# Patient Record
Sex: Female | Born: 1995 | Race: White | Hispanic: No | Marital: Single | Smoking: Never smoker
Health system: Southern US, Community
[De-identification: ages and names within clinical notes are randomized; demographics above are authoritative.]

## PROBLEM LIST (undated history)

## (undated) DIAGNOSIS — F39 Unspecified mood [affective] disorder: Secondary | ICD-10-CM

## (undated) DIAGNOSIS — F32A Depression, unspecified: Secondary | ICD-10-CM

## (undated) DIAGNOSIS — F419 Anxiety disorder, unspecified: Secondary | ICD-10-CM

## (undated) DIAGNOSIS — F329 Major depressive disorder, single episode, unspecified: Secondary | ICD-10-CM

## (undated) DIAGNOSIS — N946 Dysmenorrhea, unspecified: Secondary | ICD-10-CM

## (undated) DIAGNOSIS — F1910 Other psychoactive substance abuse, uncomplicated: Secondary | ICD-10-CM

## (undated) HISTORY — DX: Major depressive disorder, single episode, unspecified: F32.9

## (undated) HISTORY — DX: Anxiety disorder, unspecified: F41.9

## (undated) HISTORY — PX: GANGLION CYST EXCISION: SHX1691

## (undated) HISTORY — DX: Dysmenorrhea, unspecified: N94.6

## (undated) HISTORY — DX: Other psychoactive substance abuse, uncomplicated: F19.10

## (undated) HISTORY — DX: Depression, unspecified: F32.A

---

## 2016-01-26 ENCOUNTER — Emergency Department (HOSPITAL_COMMUNITY)
Admission: EM | Admit: 2016-01-26 | Discharge: 2016-01-26 | Disposition: A | Discharge status: Home / Self Care | Payer: 59 | Attending: Emergency Medicine | Admitting: Emergency Medicine

## 2016-01-26 ENCOUNTER — Encounter (HOSPITAL_COMMUNITY): Payer: Self-pay

## 2016-01-26 ENCOUNTER — Emergency Department (HOSPITAL_COMMUNITY): Payer: 59

## 2016-01-26 DIAGNOSIS — Z8659 Personal history of other mental and behavioral disorders: Secondary | ICD-10-CM | POA: Insufficient documentation

## 2016-01-26 DIAGNOSIS — K5900 Constipation, unspecified: Secondary | ICD-10-CM | POA: Insufficient documentation

## 2016-01-26 DIAGNOSIS — R1031 Right lower quadrant pain: Secondary | ICD-10-CM

## 2016-01-26 DIAGNOSIS — Z3202 Encounter for pregnancy test, result negative: Secondary | ICD-10-CM | POA: Insufficient documentation

## 2016-01-26 DIAGNOSIS — Z79899 Other long term (current) drug therapy: Secondary | ICD-10-CM | POA: Insufficient documentation

## 2016-01-26 DIAGNOSIS — R109 Unspecified abdominal pain: Secondary | ICD-10-CM | POA: Diagnosis present

## 2016-01-26 HISTORY — DX: Unspecified mood (affective) disorder: F39

## 2016-01-26 LAB — COMPREHENSIVE METABOLIC PANEL
ALT: 22 U/L (ref 14–54)
AST: 28 U/L (ref 15–41)
Albumin: 4.5 g/dL (ref 3.5–5.0)
Alkaline Phosphatase: 62 U/L (ref 38–126)
Anion gap: 8 (ref 5–15)
BILIRUBIN TOTAL: 0.7 mg/dL (ref 0.3–1.2)
BUN: 16 mg/dL (ref 6–20)
CHLORIDE: 105 mmol/L (ref 101–111)
CO2: 26 mmol/L (ref 22–32)
Calcium: 9.1 mg/dL (ref 8.9–10.3)
Creatinine, Ser: 0.8 mg/dL (ref 0.44–1.00)
GFR calc Af Amer: >60 mL/min (ref >60)
GFR calc non Af Amer: >60 mL/min (ref >60)
GLUCOSE: 91 mg/dL (ref 65–99)
POTASSIUM: 4.1 mmol/L (ref 3.5–5.1)
Sodium: 139 mmol/L (ref 135–145)
TOTAL PROTEIN: 7.6 g/dL (ref 6.5–8.1)

## 2016-01-26 LAB — CBC
HEMATOCRIT: 45.6 % (ref 36.0–46.0)
HEMOGLOBIN: 15.4 g/dL — AB (ref 12.0–15.0)
MCH: 27.6 pg (ref 26.0–34.0)
MCHC: 33.8 g/dL (ref 30.0–36.0)
MCV: 81.9 fL (ref 78.0–100.0)
Platelets: 389 10*3/uL (ref 150–400)
RBC: 5.57 MIL/uL — ABNORMAL HIGH (ref 3.87–5.11)
RDW: 12.8 % (ref 11.5–15.5)
WBC: 9.4 10*3/uL (ref 4.0–10.5)

## 2016-01-26 LAB — I-STAT BETA HCG BLOOD, ED (MC, WL, AP ONLY): I-stat hCG, quantitative: <5 m[IU]/mL (ref <5)

## 2016-01-26 LAB — LIPASE, BLOOD: Lipase: 18 U/L (ref 11–51)

## 2016-01-26 MED ORDER — DIATRIZOATE MEGLUMINE & SODIUM 66-10 % PO SOLN: 15.0000 mL | Freq: Once | ORAL | Status: DC

## 2016-01-26 MED ORDER — FENTANYL CITRATE (PF) 100 MCG/2ML IJ SOLN
50.0000 ug | Freq: Once | INTRAMUSCULAR | Status: AC
  Administered 2016-01-26: 50 ug via INTRAVENOUS
  Filled 2016-01-26: qty 2

## 2016-01-26 MED ORDER — IOPAMIDOL (ISOVUE-300) INJECTION 61%
100.0000 mL | Freq: Once | INTRAVENOUS | Status: AC | PRN
  Administered 2016-01-26: 100 mL via INTRAVENOUS

## 2016-01-26 NOTE — ED Notes (Signed)
Pt stayed in the bathroom for literally 20 minutes trying for a UA

## 2016-01-26 NOTE — ED Provider Notes (Signed)
CSN: [id number]     Arrival date & time 01/26/16  1111 History   First MD Initiated Contact with Patient 01/26/16 1134     Chief Complaint  Patient presents with  . Abdominal Pain    (Consider location/radiation/quality/duration/timing/severity/associated sxs/prior Treatment) Patient is a 20 y.o. female presenting with abdominal pain. The history is provided by the patient and medical records. No language interpreter was used.  Abdominal Pain Associated symptoms: no chills, no cough, no dysuria, no fever, no nausea, no shortness of breath, no sore throat and no vomiting    Dula Heidi Mcgee is a 20 y.o. female  who presents to the Emergency Department complaining of 6/10 abdominal pain that began acutely at 3:00 this morning. Patient states pain initially began in the center (periumbilical) stomach, but has now began to radiate down to her right groin region. Patient denies fever, nausea, vomiting, dysuria, diarrhea/constipation. No medications taken prior to arrival for symptoms. No alleviating or aggravating factors noted.  Past Medical History  Diagnosis Date  . Mood disorder [facility])    Past Surgical History  Procedure Laterality Date  . Ganglion cyst excision Right    History reviewed. No pertinent family history. Social History  Substance Use Topics  . Smoking status: Never Smoker   . Smokeless tobacco: None  . Alcohol Use: Yes   OB History    No data available     Review of Systems  Constitutional: Negative for fever and chills.  HENT: Negative for congestion and sore throat.   Eyes: Negative for visual disturbance.  Respiratory: Negative for cough and shortness of breath.   Cardiovascular: Negative.   Gastrointestinal: Positive for abdominal pain. Negative for nausea and vomiting.  Genitourinary: Negative for dysuria.  Musculoskeletal: Negative for back pain.  Skin: Negative for rash.  Neurological: Negative for dizziness.      Allergies  Review of patient's  allergies indicates no known allergies.  Home Medications   Prior to Admission medications   Medication Sig Start Date End Date Taking? Authorizing Provider  busPIRone (BUSPAR) 10 MG tablet Take 10 mg by mouth at bedtime.   Yes Historical Provider, MD  LamoTRIgine (LAMICTAL XR) 300 MG TB24 Take 1 tablet by mouth at bedtime.   Yes Historical Provider, MD  metroNIDAZOLE (FLAGYL) 500 MG tablet Take 500 mg by mouth 2 (two) times daily. Started 7 day supply on 01/21/2016   Yes Historical Provider, MD  testosterone cypionate (DEPOTESTOSTERONE CYPIONATE) 200 MG/ML injection Inject 100 mg into the muscle every 14 (fourteen) days.   Yes Historical Provider, MD   BP 130/79 mmHg  Pulse 80  Temp(Src) 98 F (36.7 C) (Oral)  Resp 18  SpO2 100% Physical Exam  Constitutional: She is oriented to person, place, and time. She appears well-developed and well-nourished.  Alert and in no acute distress  HENT:  Head: Normocephalic and atraumatic.  Cardiovascular: Normal rate, regular rhythm, normal heart sounds and intact distal pulses.  Exam reveals no gallop and no friction rub.   No murmur heard. Pulmonary/Chest: Effort normal and breath sounds normal. No respiratory distress. She has no wheezes. She has no rales. She exhibits no tenderness.  Abdominal: Soft. Bowel sounds are normal. She exhibits no distension. There is tenderness. There is guarding and tenderness at McBurney's point.  + obturator sign.   Musculoskeletal: She exhibits no edema.  Neurological: She is alert and oriented to person, place, and time.  Skin: Skin is warm and dry.  Nursing note and vitals reviewed.  ED Course  Procedures (including critical care time) Labs Review Labs Reviewed  CBC - Abnormal; Notable for the following:    RBC 5.57 (*)    Hemoglobin 15.4 (*)    All other components within normal limits  LIPASE, BLOOD  COMPREHENSIVE METABOLIC PANEL  I-STAT BETA HCG BLOOD, ED ([facility], [facility], [facility] ONLY)    Imaging Review Ct  Abdomen Pelvis W Contrast  01/26/2016  CLINICAL DATA:  RIGHT lower quadrant pain. EXAM: CT ABDOMEN AND PELVIS WITH CONTRAST TECHNIQUE: Multidetector CT imaging of the abdomen and pelvis was performed using the standard protocol following bolus administration of intravenous contrast. CONTRAST:  100mL ISOVUE-300 IOPAMIDOL (ISOVUE-300) INJECTION 61% COMPARISON:  None. FINDINGS: Lower chest: Lung bases are clear. Hepatobiliary: No focal hepatic lesion. No biliary duct dilatation. Gallbladder is normal. Common bile duct is normal. Pancreas: Pancreas is normal. No ductal dilatation. No pancreatic inflammation. Spleen: Normal spleen Adrenals/urinary tract: Adrenal glands and kidneys are normal. The ureters and bladder normal. Stomach/Bowel: Stomach, small bowel, cecum normal. The appendix is not confidently identified. There is no pericecal inflammation to suggest acute appendicitis. Moderate volume stool in the RIGHT colon. Moderate volume stool in the rectosigmoid colon. Vascular/Lymphatic: Abdominal aorta is normal caliber. There is no retroperitoneal or periportal lymphadenopathy. No pelvic lymphadenopathy. Reproductive: Uterus and ovaries are normal. Other: No free fluid. Musculoskeletal: No aggressive osseous lesion. IMPRESSION: 1. No acute abdominal or pelvic findings. 2. Appendix is not completely identified but no secondary signs of appendicitis. 3. Ovaries and uterus  normal. 4. Normal gallbladder. 5. Normal kidneys. Electronically Signed   By: Genevive BiStewart  Edmunds M.D.   On: 01/26/2016 14:14   I have personally reviewed and evaluated these images and lab results as part of my medical decision-making.   EKG Interpretation None      MDM   Final diagnoses:  Constipation, unspecified constipation type  Right lower quadrant abdominal pain   Heidi Mcgee presents to ED for right lower quadrant abdominal pain. On exam, there is tenderness at McBurney's point and a positive obturator sign. I am  concerned for appendicitis. Labs and CT will be obtained.  All labs reviewed and reassuring.   CT shows no acute abdominal or pelvic findings. It does show moderate amount of stool in the right colon c/w area of pain on examination. CT findings were discussed with Dr. Particia NearingHaviland. Patient reevaluated and results discussed. Patient is nontoxic, nonseptic appearing, in no apparent distress. Labs and imaging and vitals reviewed again prior to discharge. Patient stopped these sores or sepsis criteria. On repeat exam, patient does not have a surgical abdomen and there are no peritoneal signs. Miralax flush regimen discussed with patient. Patient safe for discharge to home with symptomatic treatment and given strict instructions for follow-up with their primary care physician. I have also discussed reasons to return immediately to the ER. Patient expresses understanding and agrees with plan.  Patient discussed with Dr. Particia NearingHaviland who agrees with treatment plan.    [facility] HospitalJaime Pilcher Biannca Scantlin, PA-C 01/26/16 1445

## 2016-01-26 NOTE — ED Notes (Signed)
Pt aware of need for urine sample.  Unable to give at this time.

## 2016-01-26 NOTE — ED Notes (Addendum)
Pt c/o RLQ starting around 0300.  Pain score 6/10.  Denies n/v/d.  Pt increasing on palpation.        Pt reports "I'm diagnosed transgender and take testosterone."  Pt does not have periods.

## 2016-01-26 NOTE — Discharge Instructions (Signed)
Miralax: 4 capfulls mixed with water or Gatorade today, then one cap daily until symptoms resolve and bowel movements regular.  Follow-up with the primary physician in 2-3 days in regards to today's visit.  Please seek immediate care if you develop any of the following symptoms: The pain does not go away.  You have a fever.  You keep throwing up (vomiting). You pass bloody or black tarry stools.  There is bright red blood in the stool.  The constipation stays for more than 4 days. There is belly (abdominal) or rectal pain.  You do not seem to be getting better.  You have any questions or concerns.

## 2016-01-26 NOTE — ED Notes (Signed)
Patient transported to CT 

## 2016-01-26 NOTE — ED Notes (Signed)
Provider aware of no urine sample.  Not needed at this time per Provider.

## 2017-07-03 IMAGING — CT CT ABD-PELV W/ CM
2 of 4 series · 16 of 46 positions shown, 18 images · IV contrast (ISOVUE)
Comparison: None.

CLINICAL DATA: RIGHT lower quadrant pain.

EXAM:
CT ABDOMEN AND PELVIS WITH CONTRAST
TECHNIQUE: Multidetector CT imaging of the abdomen and pelvis was performed
using the standard protocol following bolus administration of
intravenous contrast.
CONTRAST:  100mL QQ9QC1-8SS IOPAMIDOL (QQ9QC1-8SS) INJECTION 61%

[Series 2: abd/pel with · axial · 0.77mm/px · z∈[+977,+1402]mm · 13 of 95 slices shown, 15 images]
[im 5/95  soft-tissue]
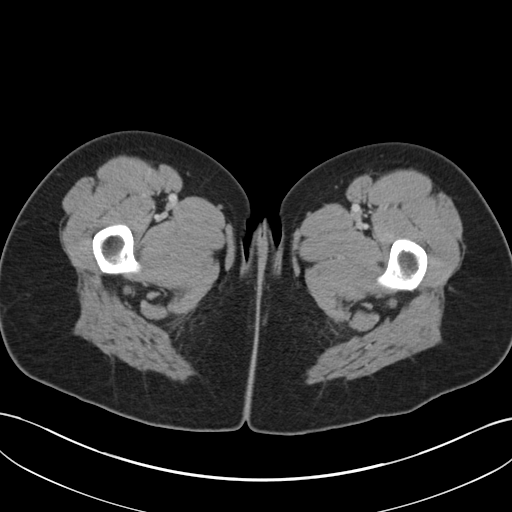
[im 5/95  bone]
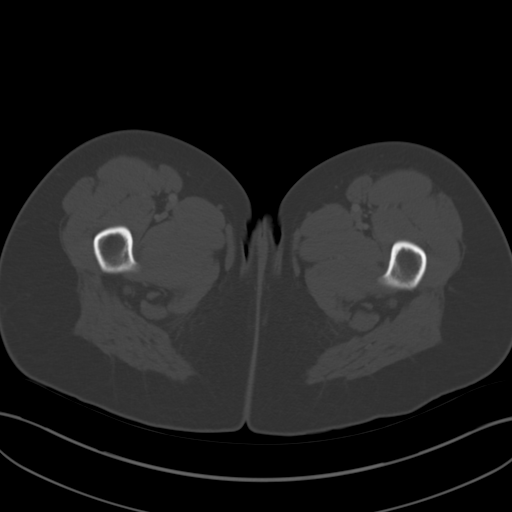
[im 15/95  soft-tissue]
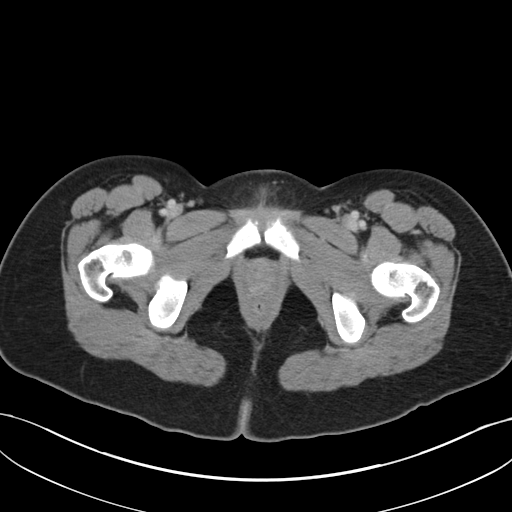
[im 19/95  soft-tissue]
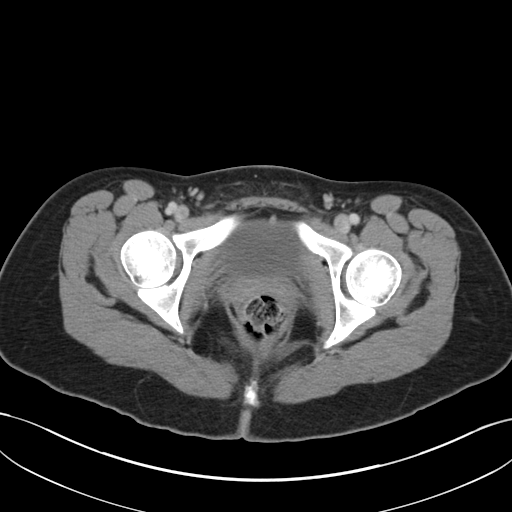
[im 29/95  soft-tissue]
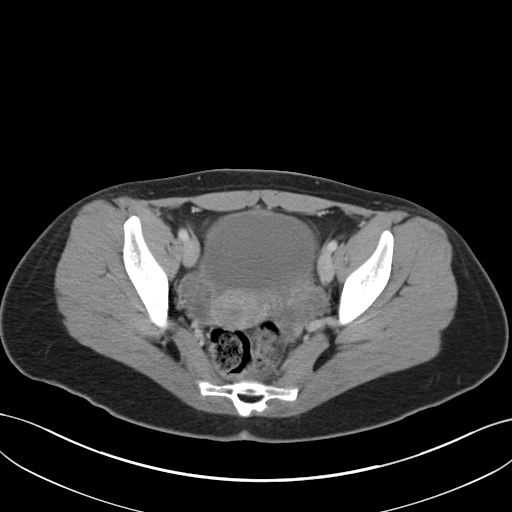
[im 33/95  soft-tissue]
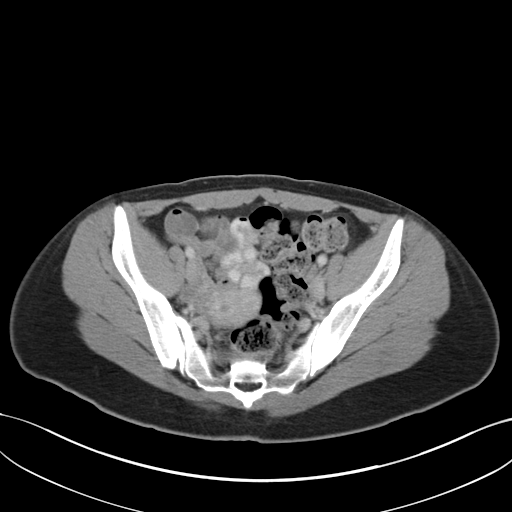
[im 43/95  soft-tissue]
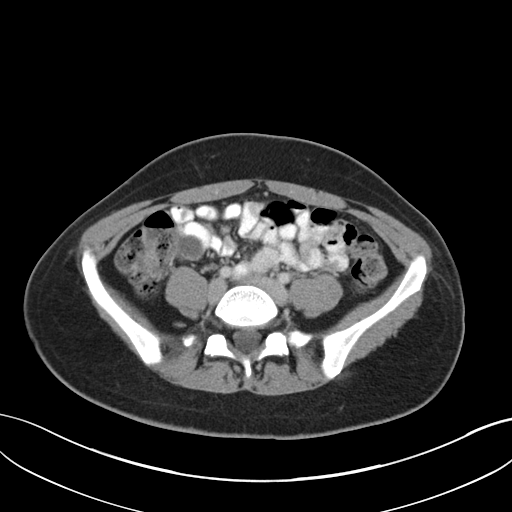
[im 48/95  soft-tissue]
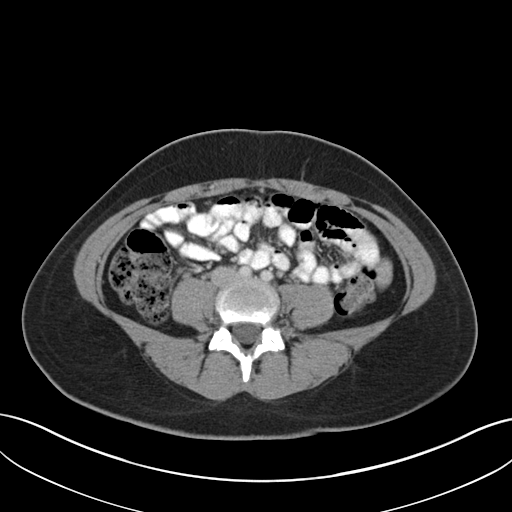
[im 52/95  soft-tissue]
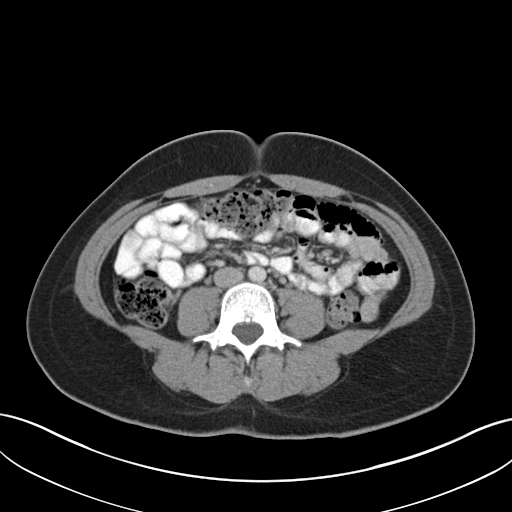
[im 62/95  soft-tissue]
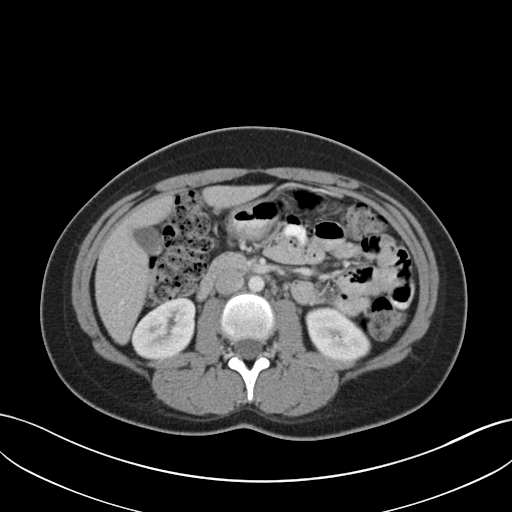
[im 62/95  bone]
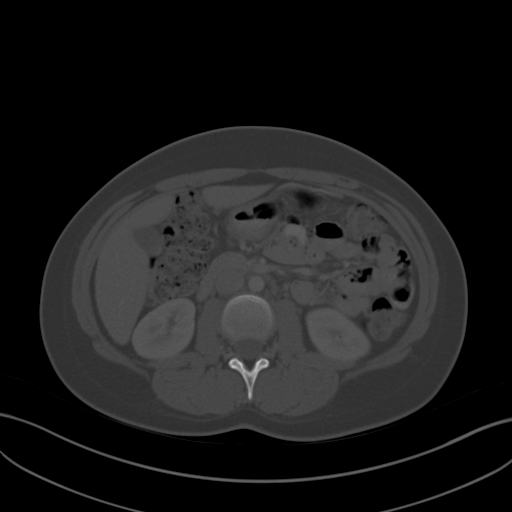
[im 66/95  soft-tissue]
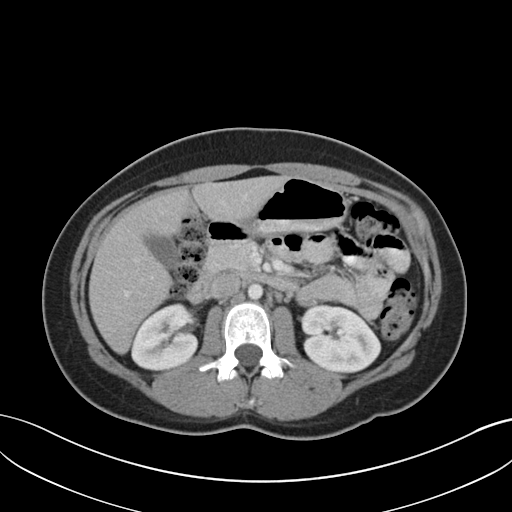
[im 76/95  soft-tissue]
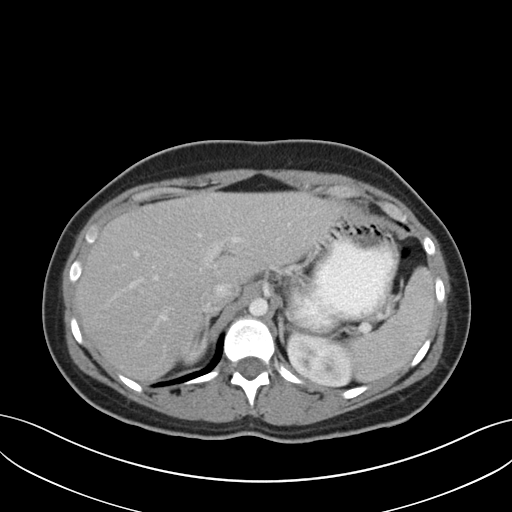
[im 80/95  soft-tissue]
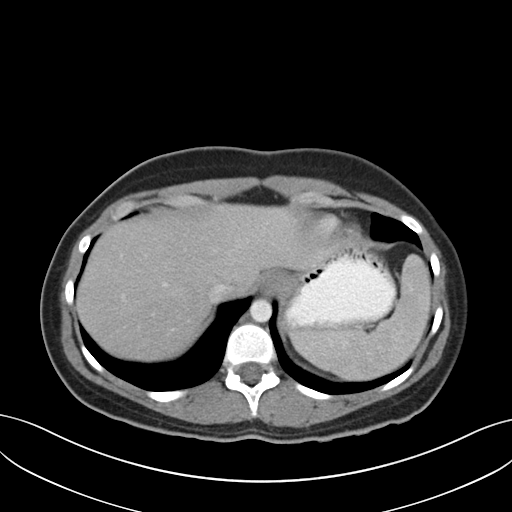
[im 90/95  soft-tissue]
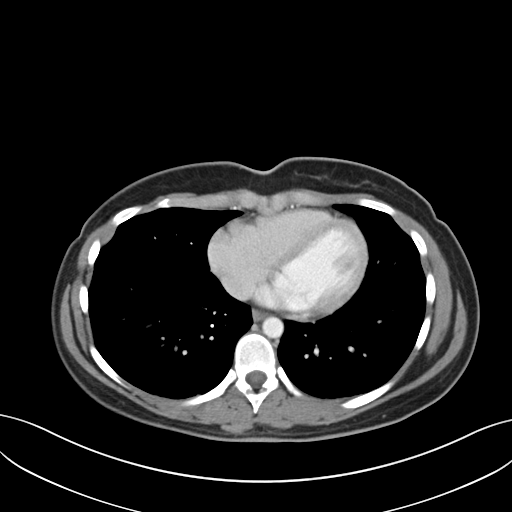

[Series 3: coronal a/|p · coronal · 0.74mm/px · 3 of 128 slices shown]
[im 43/128  soft-tissue]
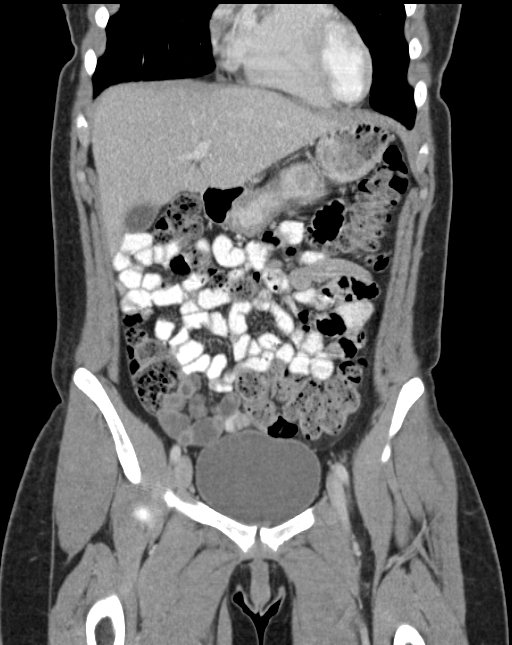
[im 57/128  soft-tissue]
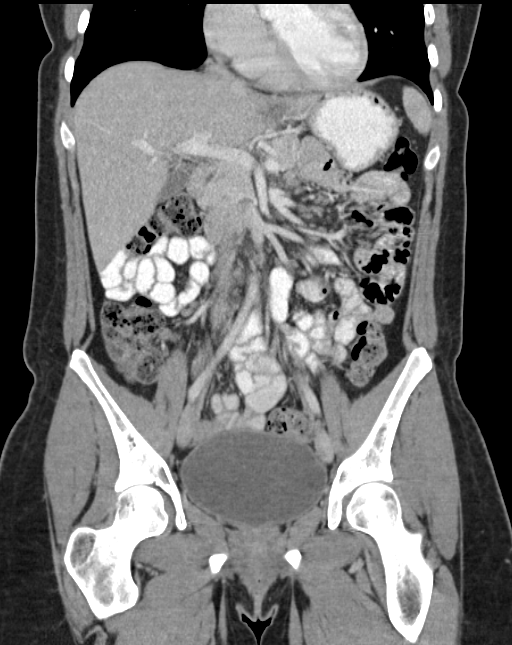
[im 71/128  soft-tissue]
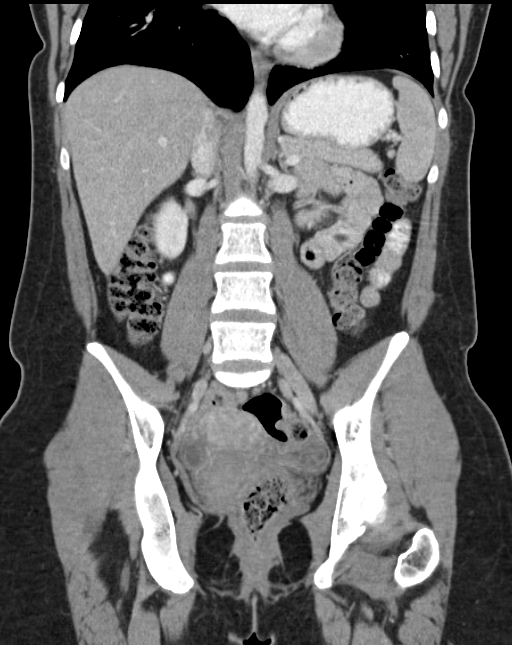

[16 of 46 positions shown; findings below may reference images not displayed]

FINDINGS: Lower chest: Lung bases are clear.

Hepatobiliary: No focal hepatic lesion. No biliary duct dilatation.
Gallbladder is normal. Common bile duct is normal.

Pancreas: Pancreas is normal. No ductal dilatation. No pancreatic
inflammation.

Spleen: Normal spleen

Adrenals/urinary tract: Adrenal glands and kidneys are normal. The
ureters and bladder normal.

Stomach/Bowel: Stomach, small bowel, cecum normal. The appendix is
not confidently identified. There is no pericecal inflammation to
suggest acute appendicitis. Moderate volume stool in the RIGHT
colon. Moderate volume stool in the rectosigmoid colon.

Vascular/Lymphatic: Abdominal aorta is normal caliber. There is no
retroperitoneal or periportal lymphadenopathy. No pelvic
lymphadenopathy.

Reproductive: Uterus and ovaries are normal.

Other: No free fluid.

Musculoskeletal: No aggressive osseous lesion.
IMPRESSION: 1. No acute abdominal or pelvic findings.
2. Appendix is not completely identified but no secondary signs of
appendicitis.
3. Ovaries and uterus  normal.
4. Normal gallbladder.
5. Normal kidneys.

## 2018-08-24 ENCOUNTER — Ambulatory Visit (INDEPENDENT_AMBULATORY_CARE_PROVIDER_SITE_OTHER): Payer: 59 | Admitting: Obstetrics and Gynecology

## 2018-08-24 ENCOUNTER — Encounter: Payer: Self-pay | Admitting: Obstetrics and Gynecology

## 2018-08-24 ENCOUNTER — Other Ambulatory Visit: Payer: Self-pay

## 2018-08-24 VITALS — BP 120/82 | HR 76 | Ht 66.5 in | Wt 141.4 lb

## 2018-08-24 DIAGNOSIS — N939 Abnormal uterine and vaginal bleeding, unspecified: Secondary | ICD-10-CM | POA: Diagnosis not present

## 2018-08-24 DIAGNOSIS — N946 Dysmenorrhea, unspecified: Secondary | ICD-10-CM | POA: Diagnosis not present

## 2018-08-24 DIAGNOSIS — R102 Pelvic and perineal pain: Secondary | ICD-10-CM | POA: Diagnosis not present

## 2018-08-24 DIAGNOSIS — Z113 Encounter for screening for infections with a predominantly sexual mode of transmission: Secondary | ICD-10-CM

## 2018-08-24 DIAGNOSIS — N898 Other specified noninflammatory disorders of vagina: Secondary | ICD-10-CM

## 2018-08-24 DIAGNOSIS — Z789 Other specified health status: Secondary | ICD-10-CM

## 2018-08-24 DIAGNOSIS — F640 Transsexualism: Secondary | ICD-10-CM

## 2018-08-24 MED ORDER — MEDROXYPROGESTERONE ACETATE 10 MG PO TABS: ORAL_TABLET | ORAL | 2 refills | Status: AC

## 2018-08-24 MED ORDER — NAPROXEN SODIUM 550 MG PO TABS: ORAL_TABLET | ORAL | 1 refills | Status: AC

## 2018-08-24 NOTE — Progress Notes (Signed)
22 y.o. G0P0000 Single White or Caucasian Not Hispanic or Latino transgender female here for painful menstrual bleeding.  He also c/o an episode of increased vaginal discharge 2 weeks ago that has currently resolved. Had an episode of swollen lymph nodes and was treated with antibiotics a couple of months ago.  Heidi Mcgee is a transgender female who has been living as a female for over 4 years and has been on testosterone for about 3 years.  About a month ago he was late for his testosterone shot and bleed for about a week, moderate flow with severe cramps (missed work). He started bleeding again yesterday, moderate cramps. He has been having intermittent pain in the left pelvis since prior to his last cycle. Pain occurs at least a few times a day, can last several hours, helped with ibuprofen. The pain is achy and sharp and ranges from a 3-7/10 in severity.  His abdomen is tender to touch.  He had a yellow vaginal d/c, ended a few weeks ago. Sexually active, female partner, together for 6 months. Normal BM a couple of times a week. No bladder c/o. 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   Period Pattern: (!) Irregular(due to testosterone inj) Menstrual Flow: Heavy, Moderate Menstrual Control Change Freq (Hours): Not wearing pad or tampon Dysmenorrhea: (!) Severe Dysmenorrhea Symptoms: Cramping  Patient's last menstrual period was 08/23/2018 (exact date).          Sexually active: Yes.    The current method of family planning is female partner.   Exercising: Yes.   body weights Smoker:  no  Health Maintenance: Pap:  2019 WNL History of abnormal Pap:  no Colonoscopy: 2017 WNL, rectal bleeding, normal study.  TDaP:  Up to date Gardasil:  Completed all 3   reports that she has never smoked. She has never used smokeless tobacco. She reports that she drinks alcohol. She reports that she has current or past drug history. Drug: Marijuana.Rare ETOH. In school for massage therapy, finishes in a few months.    Past Medical History:  Diagnosis Date  . Anxiety   . Depression   . Dysmenorrhea   . Mood disorder (HCC)   . Substance abuse (HCC)   Feels safe. Primary is managing psychiatric issues. Can't afford psychiatrist in the past. Was seen in the past her prior to the testosterone. Primary manages the testosterone.   Past Surgical History:  Procedure Laterality Date  . GANGLION CYST EXCISION Right     Current Outpatient Medications  Medication Sig Dispense Refill  . buPROPion (WELLBUTRIN XL) 300 MG 24 hr tablet Take 300 mg by mouth daily.    . cloNIDine (CATAPRES) 0.1 MG tablet Take 0.1 mg by mouth daily.    Heidi Mcgee Kitchen. ibuprofen (ADVIL,MOTRIN) 200 MG tablet Take 200 mg by mouth every 6 (six) hours as needed.    . LamoTRIgine (LAMICTAL XR) 300 MG TB24 Take 1 tablet by mouth at bedtime.    Heidi Mcgee Kitchen. testosterone cypionate (DEPOTESTOSTERONE CYPIONATE) 200 MG/ML injection Inject 100 mg into the muscle every 14 (fourteen) days.    . busPIRone (BUSPAR) 10 MG tablet Take 10 mg by mouth at bedtime.     No current facility-administered medications for this visit.     Family History  Problem Relation Age of Onset  . Polycystic ovary syndrome Sister   . Breast cancer Maternal Grandmother     Review of Systems  Constitutional: Negative.   HENT: Negative.   Eyes: Negative.   Respiratory: Negative.  Cardiovascular: Negative.   Gastrointestinal: Negative.   Endocrine: Negative.   Genitourinary: Positive for menstrual problem and vaginal bleeding.       Painful menses  Musculoskeletal: Negative.   Skin: Negative.   Allergic/Immunologic: Negative.   Neurological: Negative.   Hematological: Negative.   Psychiatric/Behavioral: Negative.     Exam:   BP 120/82 (BP Location: Right Arm, Patient Position: Sitting, Cuff Size: Normal)   Pulse 76   Ht 5' 6.5" (1.689 m)   Wt 141 lb 6.4 oz (64.1 kg)   LMP 08/23/2018 (Exact Date)   BMI 22.48 kg/m   Weight change: @WEIGHTCHANGE @ Height:   Height: 5' 6.5"  (168.9 cm)  Ht Readings from Last 3 Encounters:  08/24/18 5' 6.5" (1.689 m)    General appearance: alert, cooperative and appears stated age Head: Normocephalic, without obvious abnormality, atraumatic Neck: no adenopathy, supple, symmetrical, trachea midline and thyroid normal to inspection and palpation Lungs: clear to auscultation bilaterally Cardiovascular: regular rate and rhythm Abdomen: soft, non-tender; non distended,  no masses,  no organomegaly Extremities: extremities normal, atraumatic, no cyanosis or edema Skin: Skin color, texture, turgor normal. No rashes or lesions Lymph nodes: Cervical, supraclavicular, and axillary nodes normal. No abnormal inguinal nodes palpated Neurologic: Grossly normal   Pelvic: External genitalia:  no lesions              Urethra:  normal appearing urethra with no masses, tenderness or lesions              Bartholins and Skenes: normal                 Vagina: normal appearing vagina with normal color and discharge, no lesions, moderate amount of blood in the vagina.               Cervix: no lesions               Bimanual Exam:  Uterus:  mobile, normal sized, mildly tender              Adnexa: no masses, tender on the left               Rectovaginal: Confirms               Anus:  normal sphincter tone, no lesions  Chaperone was present for exam.  A:  Transgender Female   Recurrent breakthrough bleeding on testosterone  Severe dysmenorrhea  Pelvic pain Left side intermittently for over a month  Vaginal discharge  P:   Genprobe, affirm  CBC, TSH, FSH, estradiol, testosterone, STD testing  Return for ultrasound  Patient desires hysterectomy  Will get letter from Psychiatry  Provera 10 mg a day until bleeding stops, then decrease to 1/2 a tablet a day, #30, 2 refills sent   After the ultrasound we will further discuss the option of hysterectomy and the pros and cons of oophorectomy.   CC: Dr Redgie Grayer

## 2018-08-25 ENCOUNTER — Telehealth: Payer: Self-pay | Admitting: Obstetrics and Gynecology

## 2018-08-25 ENCOUNTER — Encounter: Payer: Self-pay | Admitting: Obstetrics and Gynecology

## 2018-08-25 LAB — CBC
HEMOGLOBIN: 14.8 g/dL (ref 11.1–15.9)
Hematocrit: 45 % (ref 34.0–46.6)
MCH: 30.1 pg (ref 26.6–33.0)
MCHC: 32.9 g/dL (ref 31.5–35.7)
MCV: 92 fL (ref 79–97)
PLATELETS: 312 10*3/uL (ref 150–450)
RBC: 4.91 x10E6/uL (ref 3.77–5.28)
RDW: 12.3 % (ref 12.3–15.4)
WBC: 6.7 10*3/uL (ref 3.4–10.8)

## 2018-08-25 LAB — GC/CHLAMYDIA PROBE AMP
CHLAMYDIA, DNA PROBE: NEGATIVE
Neisseria gonorrhoeae by PCR: NEGATIVE

## 2018-08-25 LAB — ESTRADIOL: ESTRADIOL: 29.5 pg/mL

## 2018-08-25 LAB — VAGINITIS/VAGINOSIS, DNA PROBE
CANDIDA SPECIES: NEGATIVE
GARDNERELLA VAGINALIS: NEGATIVE
TRICHOMONAS VAG: NEGATIVE

## 2018-08-25 LAB — HEP, RPR, HIV PANEL
HIV Screen 4th Generation wRfx: NONREACTIVE
Hepatitis B Surface Ag: NEGATIVE
RPR Ser Ql: NONREACTIVE

## 2018-08-25 LAB — HEPATITIS C ANTIBODY: Hep C Virus Ab: <0.1 s/co ratio (ref 0.0–0.9)

## 2018-08-25 LAB — FOLLICLE STIMULATING HORMONE: FSH: 3.6 m[IU]/mL

## 2018-08-25 LAB — TSH: TSH: 4.52 u[IU]/mL — AB (ref 0.450–4.500)

## 2018-08-25 LAB — TESTOSTERONE: TESTOSTERONE: 571 ng/dL — AB (ref 8–48)

## 2018-08-25 NOTE — Telephone Encounter (Signed)
Call placed to convey benefits for ultrasound. °

## 2018-08-27 LAB — T4, FREE: FREE T4: 1.05 ng/dL (ref 0.82–1.77)

## 2018-08-27 LAB — SPECIMEN STATUS REPORT

## 2018-08-27 LAB — T3, FREE: T3, Free: 3.5 pg/mL (ref 2.0–4.4)

## 2018-08-30 ENCOUNTER — Other Ambulatory Visit: Payer: 59

## 2018-08-30 ENCOUNTER — Other Ambulatory Visit: Payer: 59 | Admitting: Obstetrics and Gynecology

## 2018-08-30 ENCOUNTER — Telehealth: Payer: Self-pay | Admitting: Obstetrics and Gynecology

## 2018-08-30 ENCOUNTER — Encounter: Payer: Self-pay | Admitting: Obstetrics and Gynecology

## 2018-08-30 NOTE — Telephone Encounter (Signed)
Spoke with patient. PUS rescheduled to 09/13/18 at 4pm, consult to follow at 4:30pm with Dr. Oscar LaJertson. Patient declined earlier appts. Patient verbalizes understanding and is agreeable. Encounter closed.

## 2018-08-30 NOTE — Telephone Encounter (Signed)
Patient called and stated [place] not [place] it in time for his US appointment today. Patient stated that he is aware of the cancellation policy.

## 2018-08-30 NOTE — Progress Notes (Deleted)
GYNECOLOGY  VISIT   HPI: 22 y.o.   Married White or Caucasian Not Hispanic or Latino  female   G0P0000 with Patient's last menstrual period was 08/23/2018 (exact date).   here for consult following PUS.     GYNECOLOGIC HISTORY: Patient's last menstrual period was 08/23/2018 (exact date). Contraception:*** Menopausal hormone therapy: ***        OB History    Gravida  0   Para  0   Term  0   Preterm  0   AB  0   Living  0     SAB  0   TAB  0   Ectopic  0   Multiple  0   Live Births  0              There are no active problems to display for this patient.   Past Medical History:  Diagnosis Date  . Anxiety   . Depression   . Dysmenorrhea   . Mood disorder (HCC)   . Substance abuse [facility])     Past Surgical History:  Procedure Laterality Date  . GANGLION CYST EXCISION Right     Current Outpatient Medications  Medication Sig Dispense Refill  . buPROPion (WELLBUTRIN XL) 300 MG 24 hr tablet Take 300 mg by mouth daily.    . busPIRone (BUSPAR) 10 MG tablet Take 10 mg by mouth at bedtime.    . cloNIDine (CATAPRES) 0.1 MG tablet Take 0.1 mg by mouth daily.    Marland Kitchen ibuprofen (ADVIL,MOTRIN) 200 MG tablet Take 200 mg by mouth every 6 (six) hours as needed.    . LamoTRIgine (LAMICTAL XR) 300 MG TB24 Take 1 tablet by mouth at bedtime.    . medroxyPROGESTERone (PROVERA) 10 MG tablet Use one tablet a day until bleeding stops, then decrease to 1/2 a tablet a day 30 tablet 2  . naproxen sodium (ANAPROX DS) 550 MG tablet One tab po q 12 hours prn 30 tablet 1  . testosterone cypionate (DEPOTESTOSTERONE CYPIONATE) 200 MG/ML injection Inject 100 mg into the muscle every 14 (fourteen) days.     No current facility-administered medications for this visit.      ALLERGIES: Patient has no known allergies.  Family History  Problem Relation Age of Onset  . Polycystic ovary syndrome Sister   . Breast cancer Maternal Grandmother     Social History   Socioeconomic History   . Marital status: Married    Spouse name: Not on file  . Number of children: Not on file  . Years of education: Not on file  . Highest education level: Not on file  Occupational History  . Not on file  Social Needs  . Financial resource strain: Not on file  . Food insecurity:    Worry: Not on file    Inability: Not on file  . Transportation needs:    Medical: Not on file    Non-medical: Not on file  Tobacco Use  . Smoking status: Never Smoker  . Smokeless tobacco: Never Used  Substance and Sexual Activity  . Alcohol use: Yes    Comment: socially, rarely  . Drug use: Yes    Types: Marijuana  . Sexual activity: Yes    Birth control/protection: None    Comment: Female partner  Lifestyle  . Physical activity:    Days per week: Not on file    Minutes per session: Not on file  . Stress: Not on file  Relationships  . Social connections:  Talks on phone: Not on file    Gets together: Not on file    Attends religious service: Not on file    Active member of club or organization: Not on file    Attends meetings of clubs or organizations: Not on file    Relationship status: Not on file  . Intimate partner violence:    Fear of current or ex partner: Not on file    Emotionally abused: Not on file    Physically abused: Not on file    Forced sexual activity: Not on file  Other Topics Concern  . Not on file  Social History Narrative  . Not on file    ROS  PHYSICAL EXAMINATION:    LMP 08/23/2018 (Exact Date)     General appearance: alert, cooperative and appears stated age Neck: no adenopathy, supple, symmetrical, trachea midline and thyroid {CHL AMB PHY EX THYROID NORM [phone]::"normal to inspection and palpation"} Breasts: {Exam; breast:13139::"normal appearance, no masses or tenderness"} Abdomen: soft, non-tender; non distended, no masses,  no organomegaly  Pelvic: External genitalia:  no lesions              Urethra:  normal appearing urethra with no  masses, tenderness or lesions              Bartholins and Skenes: normal                 Vagina: normal appearing vagina with normal color and discharge, no lesions              Cervix: {CHL AMB PHY EX CERVIX NORM DEFAULT:[phone]::"no lesions"}              Bimanual Exam:  Uterus:  {CHL AMB PHY EX UTERUS NORM DEFAULT:[phone]::"normal size, contour, position, consistency, mobility, non-tender"}              Adnexa: {CHL AMB PHY EX ADNEXA NO MASS DEFAULT:[phone]::"no mass, fullness, tenderness"}              Rectovaginal: {yes no:[id number]}.  Confirms.              Anus:  normal sphincter tone, no lesions  Chaperone was present for exam.  ASSESSMENT     PLAN    An After Visit Summary was printed and given to the patient.  *** minutes face to face time of which over 50% was spent in counseling.

## 2018-09-12 NOTE — Progress Notes (Signed)
GYNECOLOGY  VISIT   HPI: 22 y.o. Single   White or Caucasian Not Hispanic or Latino  female   [id number] with Patient's last menstrual period was 08/23/2018 (exact date).   here for consult following PUS. He is a transgender female who has been living as a man for over 4 years and has been on testosterone for ~3 years. He has been having intermittent pelvic pain and breakthrough bleeding while on the testosterone (occasionally gets his shot late). The pain has been off and on for several months. At his visit several weeks ago he was started on provera which did stop his vaginal bleeding. He would like to proceed with hysterectomy, for both his symptoms and gender identity.   GYNECOLOGIC HISTORY:  Patient's last menstrual period was 08/23/2018 (exact date). Contraception:None, Female partner Menopausal hormone therapy: None       OB History    Gravida  0   Para  0   Term  0   Preterm  0   AB  0   Living  0     SAB  0   TAB  0   Ectopic  0   Multiple  0   Live Births  0              There are no active problems to display for this patient.   Past Medical History:  Diagnosis Date  . Anxiety   . Depression   . Dysmenorrhea   . Mood disorder (HCC)   . Substance abuse [facility](HCC)     Past Surgical History:  Procedure Laterality Date  . GANGLION CYST EXCISION Right     Current Outpatient Medications  Medication Sig Dispense Refill  . buPROPion (WELLBUTRIN XL) 300 MG 24 hr tablet Take 300 mg by mouth daily.    . busPIRone (BUSPAR) 10 MG tablet Take 10 mg by mouth at bedtime.    . cloNIDine (CATAPRES) 0.1 MG tablet Take 0.1 mg by mouth daily.    Marland Kitchen. ibuprofen (ADVIL,MOTRIN) 200 MG tablet Take 200 mg by mouth every 6 (six) hours as needed.    . LamoTRIgine (LAMICTAL XR) 300 MG TB24 Take 1 tablet by mouth at bedtime.    . medroxyPROGESTERone (PROVERA) 10 MG tablet Use one tablet a day until bleeding stops, then decrease to 1/2 a tablet a day 30 tablet 2  . naproxen sodium  (ANAPROX DS) 550 MG tablet One tab po q 12 hours prn 30 tablet 1  . testosterone cypionate (DEPOTESTOSTERONE CYPIONATE) 200 MG/ML injection Inject 100 mg into the muscle every 14 (fourteen) days.     No current facility-administered medications for this visit.      ALLERGIES: Patient has no known allergies.  Family History  Problem Relation Age of Onset  . Polycystic ovary syndrome Sister   . Breast cancer Maternal Grandmother     Social History   Socioeconomic History  . Marital status: Married    Spouse name: Not on file  . Number of children: Not on file  . Years of education: Not on file  . Highest education level: Not on file  Occupational History  . Not on file  Social Needs  . Financial resource strain: Not on file  . Food insecurity:    Worry: Not on file    Inability: Not on file  . Transportation needs:    Medical: Not on file    Non-medical: Not on file  Tobacco Use  . Smoking status: Never Smoker  .  Smokeless tobacco: Never Used  Substance and Sexual Activity  . Alcohol use: Yes    Comment: socially, rarely  . Drug use: Yes    Types: Marijuana  . Sexual activity: Yes    Birth control/protection: None    Comment: Female partner  Lifestyle  . Physical activity:    Days per week: Not on file    Minutes per session: Not on file  . Stress: Not on file  Relationships  . Social connections:    Talks on phone: Not on file    Gets together: Not on file    Attends religious service: Not on file    Active member of club or organization: Not on file    Attends meetings of clubs or organizations: Not on file    Relationship status: Not on file  . Intimate partner violence:    Fear of current or ex partner: Not on file    Emotionally abused: Not on file    Physically abused: Not on file    Forced sexual activity: Not on file  Other Topics Concern  . Not on file  Social History Narrative  . Not on file    Review of Systems  Constitutional: Negative.    HENT: Negative.   Eyes: Negative.   Respiratory: Negative.   Cardiovascular: Negative.   Gastrointestinal: Negative.   Genitourinary: Negative.        Pelvic pain  Musculoskeletal: Negative.   Skin: Negative.   Neurological: Negative.   Endo/Heme/Allergies: Negative.   Psychiatric/Behavioral: Negative.     PHYSICAL EXAMINATION:    BP 120/72 (BP Location: Right Arm, Patient Position: Sitting, Cuff Size: Normal)   Pulse 76   LMP 08/23/2018 (Exact Date)     General appearance: alert, cooperative and appears stated age   Pelvic ultrasound reviewed, thin endometrial stripe, dominant follicle seen on the right side  ASSESSMENT Transgender female Break through bleeding and intermittent pelvic pain on testosterone, normal gyn ultrasound (thin endometrium, ovarian follicle seen)     PLAN Desires hysterectomy We discussed the option of oophorectomy. Discussed the continued option of having a genetic child if he keeps his ovaries. Discussed potential risks of oophorectomy including bone loss, heart disease. Discussed that the risk of bone loss would increase if he went off of testosterone for any reason He will need 2 letters from a mental health provider prior to having a hysterectomy, he otherwise meets the criteria of the [employer] Criteria for surgery   An After Visit Summary was printed and given to the patient.  ~20 minutes face to face time of which over 50% was spent in counseling.   CC: Redgie Grayer, MD

## 2018-09-13 ENCOUNTER — Encounter: Payer: Self-pay | Admitting: Obstetrics and Gynecology

## 2018-09-13 ENCOUNTER — Ambulatory Visit (INDEPENDENT_AMBULATORY_CARE_PROVIDER_SITE_OTHER): Payer: 59 | Admitting: Obstetrics and Gynecology

## 2018-09-13 ENCOUNTER — Ambulatory Visit (INDEPENDENT_AMBULATORY_CARE_PROVIDER_SITE_OTHER): Payer: 59

## 2018-09-13 ENCOUNTER — Other Ambulatory Visit: Payer: Self-pay

## 2018-09-13 ENCOUNTER — Other Ambulatory Visit: Payer: Self-pay | Admitting: Obstetrics and Gynecology

## 2018-09-13 VITALS — BP 120/72 | HR 76

## 2018-09-13 DIAGNOSIS — N946 Dysmenorrhea, unspecified: Secondary | ICD-10-CM

## 2018-09-13 DIAGNOSIS — N939 Abnormal uterine and vaginal bleeding, unspecified: Secondary | ICD-10-CM

## 2018-09-13 DIAGNOSIS — N921 Excessive and frequent menstruation with irregular cycle: Secondary | ICD-10-CM

## 2018-09-13 DIAGNOSIS — R102 Pelvic and perineal pain: Secondary | ICD-10-CM

## 2018-09-13 DIAGNOSIS — F640 Transsexualism: Secondary | ICD-10-CM | POA: Diagnosis not present

## 2018-09-13 DIAGNOSIS — Z789 Other specified health status: Secondary | ICD-10-CM

## 2018-09-14 ENCOUNTER — Encounter: Payer: Self-pay | Admitting: Obstetrics and Gynecology

## 2018-10-28 ENCOUNTER — Encounter: Payer: 59 | Admitting: Obstetrics & Gynecology
